# Patient Record
Sex: Male | Born: 1970 | Race: Black or African American | Hispanic: No | Marital: Married | Smoking: Former smoker
Health system: Southern US, Community
[De-identification: ages and names within clinical notes are randomized; demographics above are authoritative.]

## PROBLEM LIST (undated history)

## (undated) HISTORY — PX: NO PAST SURGERIES: SHX2092

---

## 2006-03-25 ENCOUNTER — Emergency Department: Payer: Self-pay | Admitting: Emergency Medicine

## 2006-03-28 ENCOUNTER — Inpatient Hospital Stay: Payer: Self-pay | Admitting: Vascular Surgery

## 2007-01-22 IMAGING — CR DG ABDOMEN 1V
1 series · 2 of 2 positions shown · non-contrast
Comparison: none

REASON FOR EXAM: Pain
COMMENTS:

[Series 1: view not recorded · 0.17mm/px · 2 of 2 slices shown]
[im 1/2]
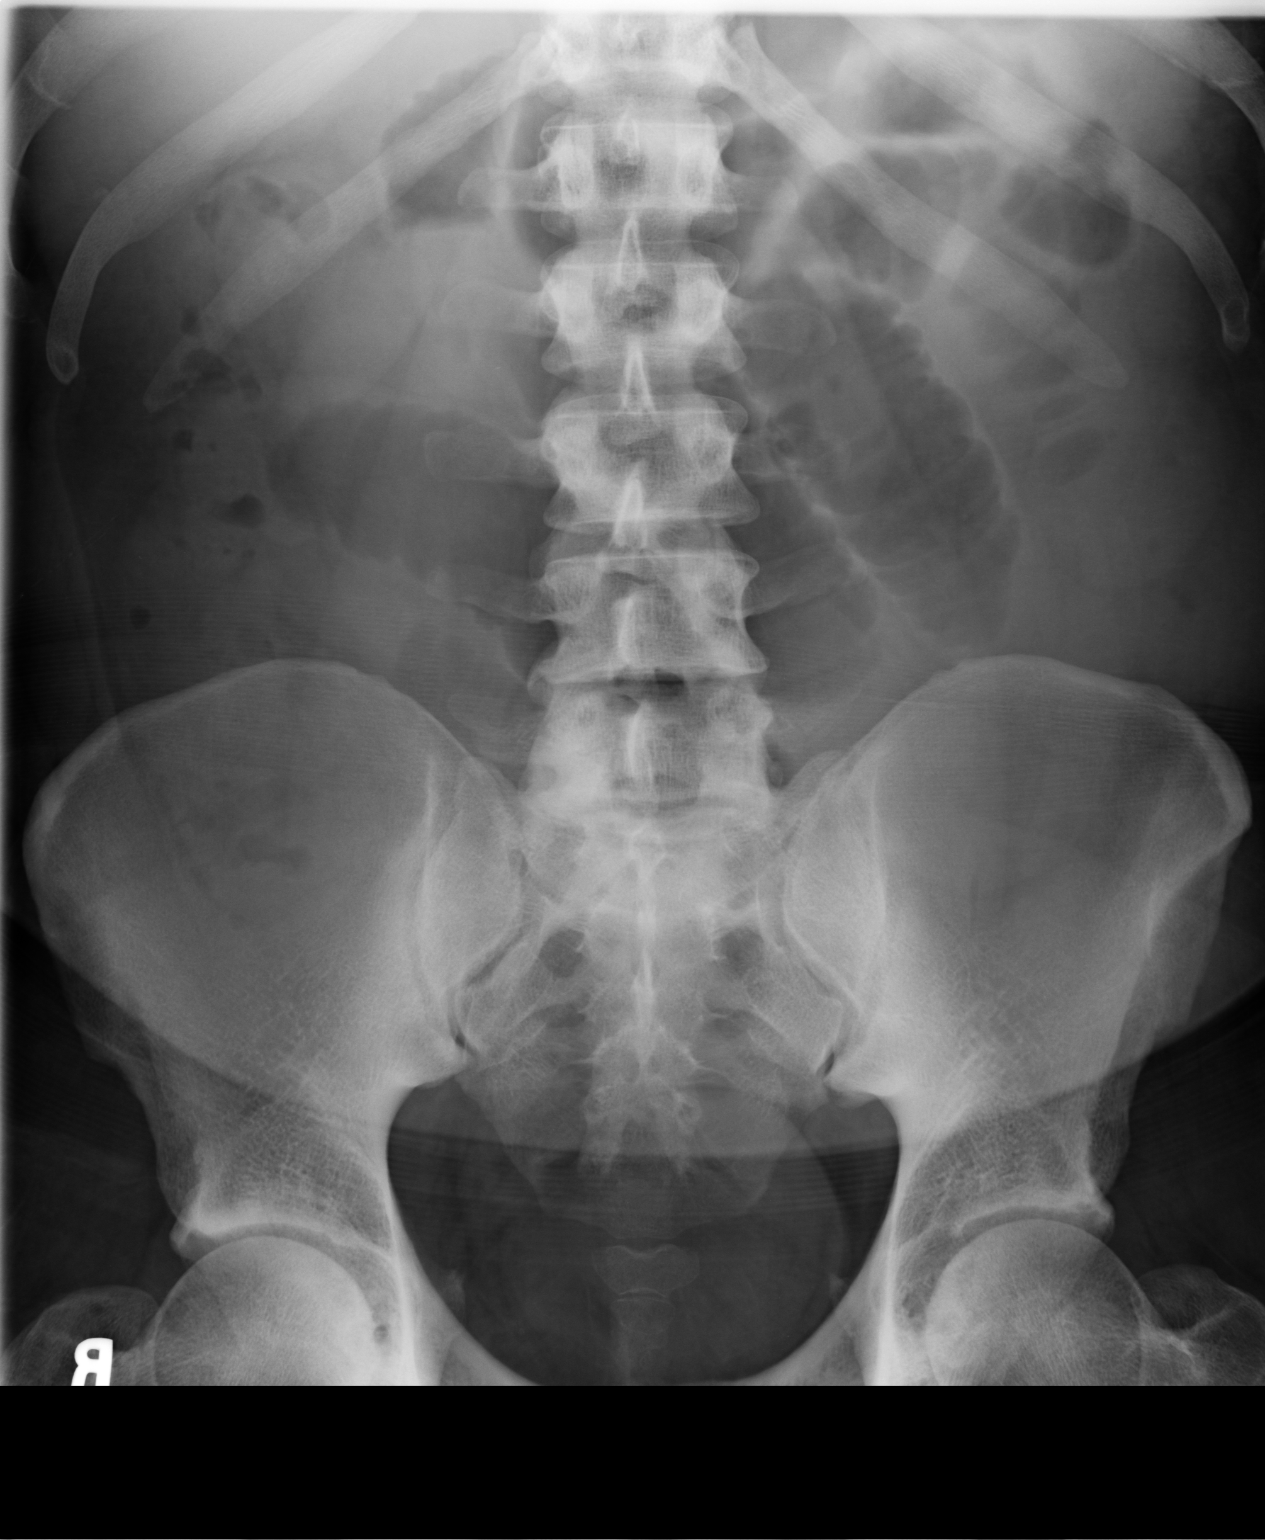
[im 2/2]
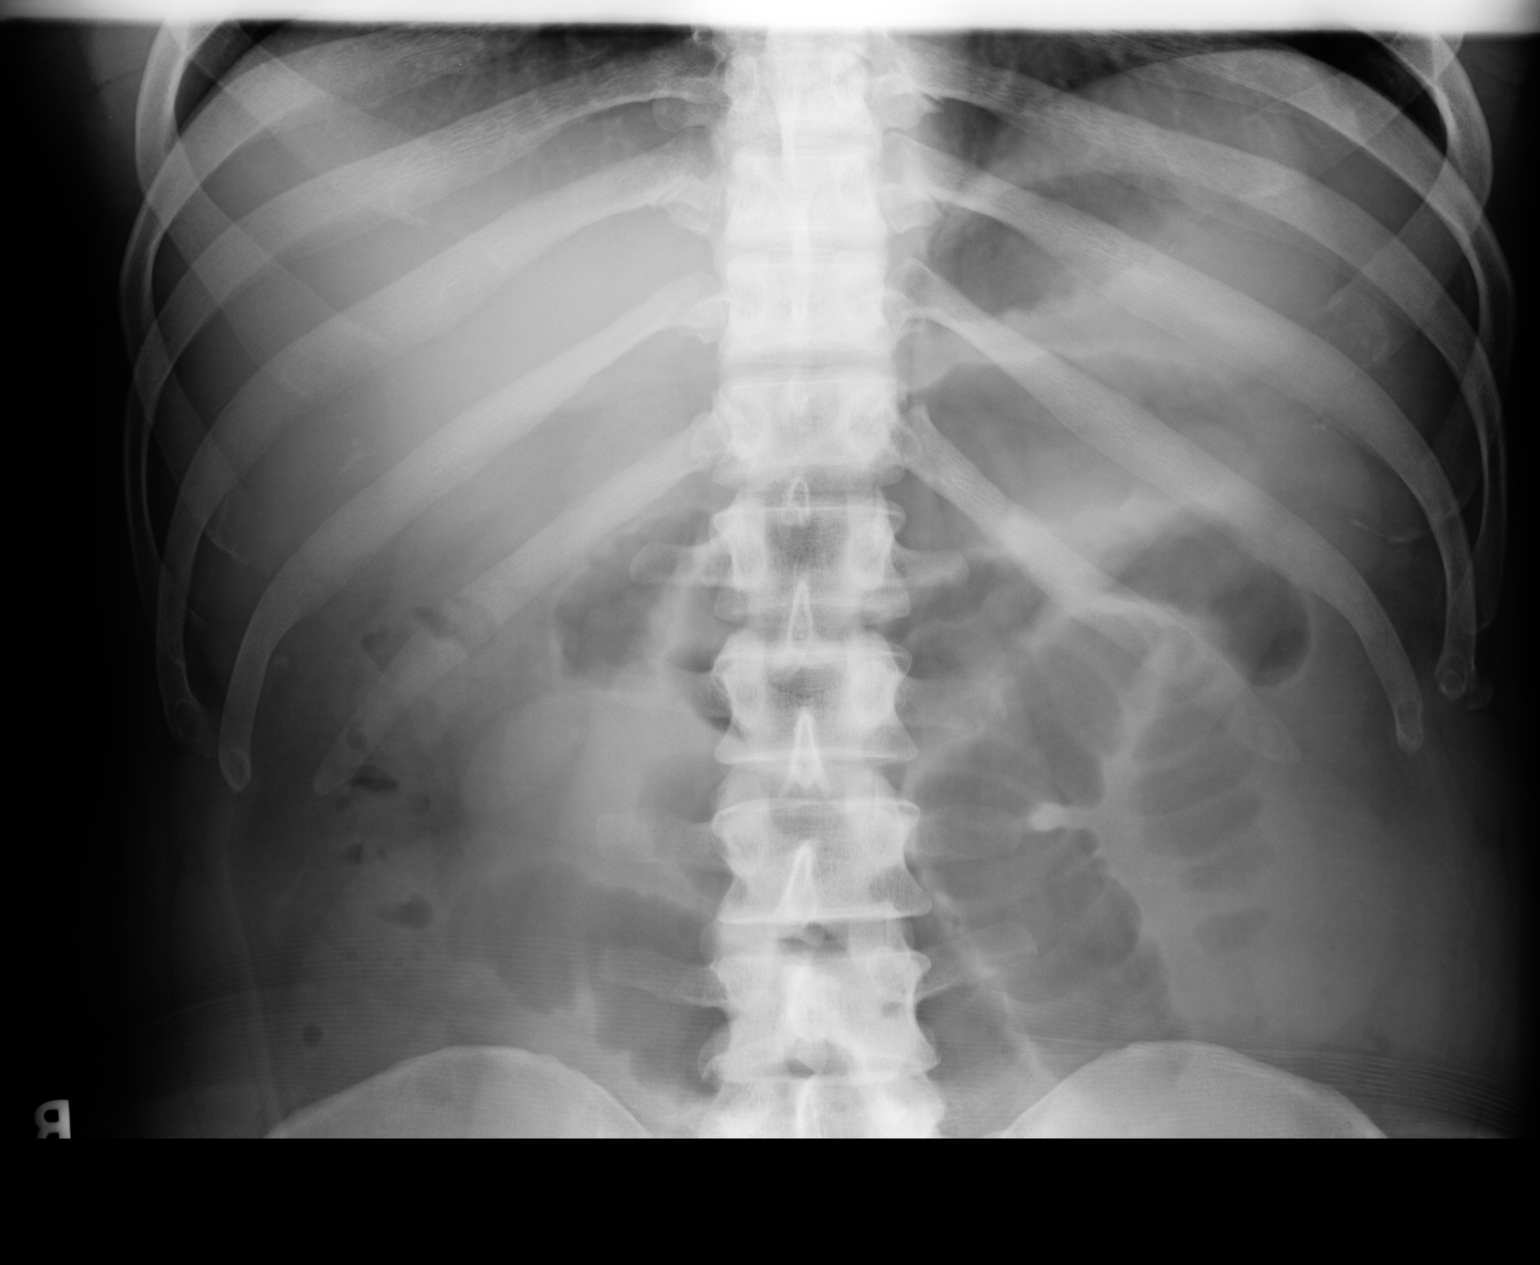

[2 of 2 positions shown; findings below may reference images not displayed]

PROCEDURE:     DXR - DXR KIDNEY URETER BLADDER  - March 28, 2006  [DATE]

RESULT:     AP view of the abdomen shows a view mildly dilated loops of
small bowel.  There is a relative decrease in colon gas.  The findings at
this point are nonspecific and could represent localized ileus or early
manifestation of small bowel obstruction.  Follow-up examination with erect
or a lateral decubitus view would be recommended if symptomatology persists.

No intraabdominal calcifications are seen.  The osseous structures are
normal in appearance.
IMPRESSION: Please see above

## 2016-11-10 DIAGNOSIS — Z0000 Encounter for general adult medical examination without abnormal findings: Secondary | ICD-10-CM | POA: Insufficient documentation

## 2016-11-10 DIAGNOSIS — M7042 Prepatellar bursitis, left knee: Secondary | ICD-10-CM | POA: Insufficient documentation

## 2016-11-10 DIAGNOSIS — Z634 Disappearance and death of family member: Secondary | ICD-10-CM | POA: Insufficient documentation

## 2019-04-13 ENCOUNTER — Other Ambulatory Visit: Payer: Self-pay

## 2019-04-13 DIAGNOSIS — Z20822 Contact with and (suspected) exposure to covid-19: Secondary | ICD-10-CM

## 2019-04-14 LAB — NOVEL CORONAVIRUS, NAA: SARS-CoV-2, NAA: NOT DETECTED

## 2021-07-30 ENCOUNTER — Encounter: Payer: Self-pay | Admitting: Family Medicine

## 2021-09-16 ENCOUNTER — Ambulatory Visit: Payer: Self-pay | Admitting: Family Medicine

## 2021-09-25 ENCOUNTER — Ambulatory Visit: Payer: Self-pay | Admitting: Family Medicine

## 2021-10-07 ENCOUNTER — Ambulatory Visit: Payer: Self-pay | Admitting: Nurse Practitioner

## 2021-10-07 NOTE — Progress Notes (Deleted)
? ?  There were no vitals taken for this visit.  ? ?Subjective:  ? ? Patient ID: Douglas Wells, male    DOB: May 19, 1971, 51 y.o.   MRN: [id number] ? ?HPI: ?Douglas Wells is a 51 y.o. male ? ?No chief complaint on file. ? ?Patient presents to clinic to establish care with new PCP.  Introduced to Publishing rights manager role and practice setting.  All questions answered.  Discussed provider/patient relationship and expectations.  Patient reports a history of ***. ?Patient denies a history of: Hypertension, Elevated Cholesterol, Diabetes, Thyroid problems, Depression, Anxiety, Neurological problems, and Abdominal problems.  ? ?Active Ambulatory Problems  ?  Diagnosis Date Noted  ? No Active Ambulatory Problems  ? ?Resolved Ambulatory Problems  ?  Diagnosis Date Noted  ? No Resolved Ambulatory Problems  ? ?No Additional Past Medical History  ? ?*** The histories are not reviewed yet. Please review them in the "History" navigator section and refresh this SmartLink. ?No family history on file. ? ? ?Review of Systems ? ?Per HPI unless specifically indicated above ? ?   ?Objective:  ?  ?There were no vitals taken for this visit.  ?Wt Readings from Last 3 Encounters:  ?No data found for Wt  ?  ?Physical Exam ? ?Results for orders placed or performed in visit on 04/13/19  ?Novel Coronavirus, NAA ([facility])  ? Specimen: Nasopharyngeal(NP) swabs in vial transport medium  ? NASOPHARYNGE  TESTING  ?Result Value Ref Range  ? SARS-CoV-2, NAA Not Detected Not Detected  ? ?   ?Assessment & Plan:  ? ?Problem List Items Addressed This Visit   ?None ?Visit Diagnoses   ? ? Encounter to establish care    -  Primary  ? ?  ?  ? ?Follow up plan: ?No follow-ups on file. ? ? ? ? ? ?

## 2022-02-24 ENCOUNTER — Ambulatory Visit: Payer: Self-pay | Admitting: Nurse Practitioner

## 2022-11-01 DIAGNOSIS — R7303 Prediabetes: Secondary | ICD-10-CM | POA: Insufficient documentation

## 2023-03-22 ENCOUNTER — Ambulatory Visit
Admission: EM | Admit: 2023-03-22 | Discharge: 2023-03-22 | Disposition: A | Discharge status: Home / Self Care | Payer: BLUE CROSS/BLUE SHIELD | Attending: Emergency Medicine | Admitting: Emergency Medicine

## 2023-03-22 ENCOUNTER — Ambulatory Visit (INDEPENDENT_AMBULATORY_CARE_PROVIDER_SITE_OTHER): Payer: BLUE CROSS/BLUE SHIELD

## 2023-03-22 DIAGNOSIS — S8392XA Sprain of unspecified site of left knee, initial encounter: Secondary | ICD-10-CM

## 2023-03-22 DIAGNOSIS — M25562 Pain in left knee: Secondary | ICD-10-CM

## 2023-03-22 DIAGNOSIS — M25462 Effusion, left knee: Secondary | ICD-10-CM

## 2023-03-22 DIAGNOSIS — E669 Obesity, unspecified: Secondary | ICD-10-CM | POA: Insufficient documentation

## 2023-03-22 DIAGNOSIS — H409 Unspecified glaucoma: Secondary | ICD-10-CM | POA: Insufficient documentation

## 2023-03-22 NOTE — ED Triage Notes (Signed)
Pt c/o L knee pain x1 day. 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

## 2023-03-22 NOTE — Discharge Instructions (Addendum)
Your xray was negative for fracture. Rest,ice,elevate left knee. May use over the counter ibuprofen or tylenol as label directed for pain. Please follow up with PCP for general medical issues. Folllow up with work regarding worker's comp and follow up with Orthopedics-call for appt.(Use work Haematologist) or Ortho of Scientific laboratory technician.  [facility]: [address] [place], [place] [address] Phone: [phone] Urgent care hours 8a-7:30p Mon-Sat

## 2023-03-22 NOTE — ED Provider Notes (Signed)
[facility]    CSN: [id number] Arrival date & time: 03/22/23  0815      History   Chief Complaint Chief Complaint  Patient presents with   Knee Pain    HPI Douglas Wells is a 52 y.o. male.   52 year old male pt, Douglas Wells, presents to urgent care for evaluation of left knee pain. 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 Awakened this am and took 4 ibuprofen, heat and ice to area.   The history is provided by the patient. No language interpreter was used.    History reviewed. No pertinent past medical history.  Patient Active Problem List   Diagnosis Date Noted   Glaucoma (increased eye pressure) 03/22/2023   Obesity (BMI 30-39.9) 03/22/2023   Sprain of left knee 03/22/2023   Acute pain of left knee 03/22/2023   Effusion of left knee 03/22/2023   Prediabetes 11/01/2022   Bereavement 11/10/2016   Encounter for routine history and physical exam for male 11/10/2016   Prepatellar bursitis of left knee 11/10/2016    Past Surgical History:  Procedure Laterality Date   NO PAST SURGERIES         Home Medications    Prior to Admission medications   Not on File    Family History History reviewed. No pertinent family history.  Social History Social History   Tobacco Use   Smoking status: Former    Types: Cigars   Smokeless tobacco: Never  Vaping Use   Vaping status: Never Used  Substance Use Topics   Alcohol use: Yes    Alcohol/week: 2.0 standard drinks of alcohol    Types: 2 Standard drinks or equivalent per week   Drug use: Never     Allergies   Patient has no known allergies.   Review of Systems Review of Systems  Constitutional:  Negative for fever.  Musculoskeletal:  Positive for arthralgias, gait problem and myalgias.  Skin: Negative.   All other systems reviewed and are negative.    Physical Exam Triage Vital  Signs ED Triage Vitals  Encounter Vitals Group     BP 03/22/23 0825 (!) 152/96     Systolic BP Percentile --      Diastolic BP Percentile --      Pulse Rate 03/22/23 0825 61     Resp 03/22/23 0825 16     Temp 03/22/23 0825 98.1 F (36.7 C)     Temp Source 03/22/23 0825 Oral     SpO2 03/22/23 0825 96 %     Weight 03/22/23 0822 240 lb (108.9 kg)     Height 03/22/23 0822 6\' 2"  (1.88 m)     Head Circumference --      Peak Flow --      Pain Score 03/22/23 0827 7     Pain Loc --      Pain Education --      Exclude from Growth Chart --    No data found.  Updated Vital Signs BP (!) 152/96 (BP Location: Left Arm)   Pulse 61   Temp 98.1 F (36.7 C) (Oral)   Resp 16   Ht 6\' 2"  (1.88 m)   Wt 240 lb (108.9 kg)   SpO2 96%   BMI 30.81 kg/m   Visual Acuity Right Eye Distance:   Left Eye Distance:   Bilateral Distance:    Right Eye Near:  Left Eye Near:    Bilateral Near:     Physical Exam Vitals and nursing note reviewed.  Constitutional:      General: He is not in acute distress.    Appearance: He is well-developed and well-groomed.  HENT:     Head: Normocephalic and atraumatic.  Eyes:     Conjunctiva/sclera: Conjunctivae normal.  Cardiovascular:     Rate and Rhythm: Normal rate and regular rhythm.     Pulses: Normal pulses.     Heart sounds: Normal heart sounds. No murmur heard. Pulmonary:     Effort: Pulmonary effort is normal. No respiratory distress.     Breath sounds: Normal breath sounds and air entry.  Abdominal:     Palpations: Abdomen is soft.     Tenderness: There is no abdominal tenderness.  Musculoskeletal:        General: No swelling.     Cervical back: Neck supple.  Skin:    General: Skin is warm and dry.     Capillary Refill: Capillary refill takes less than 2 seconds.  Neurological:     General: No focal deficit present.     Mental Status: He is alert and oriented to person, place, and time.     GCS: GCS eye subscore is 4. GCS verbal subscore  is 5. GCS motor subscore is 6.     Cranial Nerves: No cranial nerve deficit.     Sensory: No sensory deficit.  Psychiatric:        Attention and Perception: Attention normal.        Mood and Affect: Mood normal.        Speech: Speech normal.        Behavior: Behavior normal. Behavior is cooperative.      UC Treatments / Results  Labs (all labs ordered are listed, but only abnormal results are displayed) Labs Reviewed - No data to display  EKG   Radiology DG Knee Complete 4 Views Left  Result Date: 03/22/2023 CLINICAL DATA:  "tweaked it" at work last night coming down hill EXAM: LEFT KNEE - COMPLETE 4+ VIEW COMPARISON:  None Available. FINDINGS: No malalignment. No acute fracture. Mild tricompartmental degenerative changes. Query small knee joint effusion. IMPRESSION: 1. Query small knee joint effusion.  No acute osseous abnormality. 2. Mild degenerative changes. Electronically Signed   By: Olive Bass M.D.   On: 03/22/2023 10:01    Procedures Procedures (including critical care time)  Medications Ordered in UC Medications - No data to display  Initial Impression / Assessment and Plan / UC Course  I have reviewed the triage vital signs and the nursing notes.  Pertinent labs & imaging results that were available during my care of the patient were reviewed by me and considered in my medical decision making (see chart for details).  Clinical Course as of 03/22/23 1016  Tue Mar 22, 2023  0853 Left knee xray pending d/t injury [JD]  1015 Negative for fracture or dislocation, ace wrap applied [JD]    Clinical Course User Index [JD] Calogero Geisen, Para March, NP    Ddx: Sprain, left knee pain,left knee effusion Final Clinical Impressions(s) / UC Diagnoses   Final diagnoses:  Sprain of left knee, unspecified ligament, initial encounter  Acute pain of left knee  Effusion of left knee     Discharge Instructions      Your xray was negative for fracture. Rest,ice,elevate left  knee. May use over the counter ibuprofen or tylenol as label directed for pain. Please  follow up with PCP for general medical issues. Folllow up with work regarding worker's comp and follow up with Orthopedics-call for appt.(Use work Haematologist) or Ortho of Scientific laboratory technician.  [facility]: [address] [place], [place] [address] Phone: [phone] Urgent care hours 8a-7:30p Mon-Sat     ED Prescriptions   None    PDMP not reviewed this encounter.   Clancy Gourd, NP 03/22/23 1016
